# Patient Record
Sex: Female | Born: 1980 | Race: White | Hispanic: No | Marital: Married | Smoking: Never smoker
Health system: Southern US, Community
[De-identification: ages and names within clinical notes are randomized; demographics above are authoritative.]

## PROBLEM LIST (undated history)

## (undated) DIAGNOSIS — E042 Nontoxic multinodular goiter: Secondary | ICD-10-CM

## (undated) HISTORY — DX: Nontoxic multinodular goiter: E04.2

---

## 2018-11-01 ENCOUNTER — Encounter: Payer: Self-pay | Admitting: Obstetrics and Gynecology

## 2018-11-11 ENCOUNTER — Other Ambulatory Visit: Payer: Self-pay

## 2018-11-15 ENCOUNTER — Other Ambulatory Visit: Payer: Self-pay

## 2018-11-15 ENCOUNTER — Encounter: Payer: Self-pay | Admitting: Obstetrics and Gynecology

## 2018-11-15 ENCOUNTER — Ambulatory Visit (INDEPENDENT_AMBULATORY_CARE_PROVIDER_SITE_OTHER): Payer: Self-pay | Admitting: Obstetrics and Gynecology

## 2018-11-15 VITALS — BP 128/74 | HR 88 | Temp 97.5°F | Resp 12 | Ht 68.5 in | Wt 156.5 lb

## 2018-11-15 DIAGNOSIS — Z01419 Encounter for gynecological examination (general) (routine) without abnormal findings: Secondary | ICD-10-CM

## 2018-11-15 DIAGNOSIS — N6320 Unspecified lump in the left breast, unspecified quadrant: Secondary | ICD-10-CM | POA: Insufficient documentation

## 2018-11-15 NOTE — Progress Notes (Signed)
Spoke with patient while in office, interpretor present. Order placed for bilateral Dx MMG and left breast US at [facility]. Hx of left breast mass from previous imaging in [place], 6 mo f/u recommended. Spoke with Benjamine Mola at [facility], self pay quote provided to patient. [facility] contact information provided, [phone]. Patient will need to contact [facility] directly to provide additional information, they will assist with scheduling. Patient advised to bring copy of breast US report to appt once scheduled. Contact information provided to patient, aware to call if any assistance needed.

## 2018-11-15 NOTE — Patient Instructions (Signed)

## 2018-11-15 NOTE — Progress Notes (Signed)
38 y.o. [id number] Married [place] female here as a new patient for an annual exam. Patient has a family hx of Breast Cancer in Mother and Paternal grandmother  6 months ago she had an US of the left breast which was routine and a mass was noted.  BI-RADS 3.  She was told to do follow up in 6 months.  Her mother was diagnosed with with breast cancer recently in her 34s. Her paternal GM also have CA of breast.   She has left breast discomfort.   Husband has infertility due to motility.   Did routine labs 6 months ago, and were normal in [place].  Moved from [place] to work for 5 years.   PCP: No PCP     Patient's last menstrual period was 10/25/2018 (approximate).     Period Cycle (Days): 26 Period Duration (Days): 4 Period Pattern: Regular Menstrual Flow: Moderate Menstrual Control: Tampon, Maxi pad Menstrual Control Change Freq (Hours): 4 Dysmenorrhea: (!) Mild Dysmenorrhea Symptoms: Cramping, Headache     Sexually active: Yes.    The current method of family planning is none.    Exercising: Yes.    training and walking Smoker:  no  Health Maintenance: Pap:  About 6 months ago in [place] -- normal per patient History of abnormal Pap:  no MMG:  Patient has had left breast US -- BIRADS 3 per patient -- family hx of Breast Cancer Colonoscopy:  n/a BMD:   n/a  Result  n/a TDaP:  Per patient, has been a long time  Gardasil:   no HIV: negative in the past Hep C: negative in the past Screening Labs:  Discuss today   reports that she has never smoked. She has never used smokeless tobacco. She reports that she does not drink alcohol or use drugs.  History reviewed. No pertinent past medical history.  History reviewed. No pertinent surgical history.  No current outpatient medications on file.   No current facility-administered medications for this visit.     Family History  Problem Relation Age of Onset  . Breast cancer Mother   . Throat cancer Paternal Aunt   . Breast  cancer Paternal Grandmother     Review of Systems  Constitutional: Negative.   HENT: Negative.   Eyes: Negative.   Respiratory: Negative.   Cardiovascular: Negative.   Gastrointestinal: Negative.   Endocrine: Negative.   Genitourinary: Negative.   Musculoskeletal: Negative.   Skin: Negative.   Allergic/Immunologic: Negative.   Neurological: Negative.   Hematological: Negative.   Psychiatric/Behavioral: Negative.     Exam:   BP 128/74   Pulse 88   Temp (!) 97.5 F (36.4 C) (Temporal)   Resp 12   Ht 5' 8.5" (1.74 m)   Wt 156 lb 8 oz (71 kg)   LMP 10/25/2018 (Approximate)   BMI 23.45 kg/m     General appearance: alert, cooperative and appears stated age Head: normocephalic, without obvious abnormality, atraumatic Neck: no adenopathy, supple, symmetrical, trachea midline and thyroid normal to inspection and palpation Lungs: clear to auscultation bilaterally Breasts: normal appearance, no masses or tenderness, No nipple retraction or dimpling, No nipple discharge or bleeding, No axillary adenopathy Heart: regular rate and rhythm Abdomen: soft, non-tender; no masses, no organomegaly Extremities: extremities normal, atraumatic, no cyanosis or edema Skin: skin color, texture, turgor normal. No rashes or lesions Lymph nodes: cervical, supraclavicular, and axillary nodes normal. Neurologic: grossly normal  Pelvic: External genitalia:  no lesions  No abnormal inguinal nodes palpated.              Urethra:  normal appearing urethra with no masses, tenderness or lesions              Bartholins and Skenes: normal                 Vagina: normal appearing vagina with normal color and discharge, no lesions              Cervix: no lesions              Pap taken: No. Bimanual Exam:  Uterus:  normal size, contour, position, consistency, mobility, non-tender              Adnexa: no mass, fullness, tenderness                Chaperone was present for exam.  Assessment:    Well woman visit with normal exam. Hx left breast mass on [place] in [place]. Fh breast cancer.  Thyroid nodules.  Hx infertility.   Plan:   Dx bilateral mammogram and left breast US at [facility].  Self breast awareness reviewed. Guidelines for Calcium, Vitamin D, regular exercise program including cardiovascular and weight bearing exercise. She will contact her provider in [place] to understand recommendations for follow up of the thyroid nodules. PNV.  Follow up annually and prn.   After visit summary provided.

## 2018-11-16 ENCOUNTER — Telehealth (HOSPITAL_COMMUNITY): Payer: Self-pay

## 2018-11-16 NOTE — Telephone Encounter (Signed)
Telephoned patient at home number. Left patient message to call [facility] and schedule appt.

## 2018-11-21 ENCOUNTER — Other Ambulatory Visit (HOSPITAL_COMMUNITY): Payer: Self-pay | Admitting: *Deleted

## 2018-11-21 DIAGNOSIS — N6320 Unspecified lump in the left breast, unspecified quadrant: Secondary | ICD-10-CM

## 2018-12-06 ENCOUNTER — Other Ambulatory Visit: Payer: Self-pay

## 2018-12-06 ENCOUNTER — Ambulatory Visit (HOSPITAL_COMMUNITY): Payer: Self-pay

## 2018-12-22 ENCOUNTER — Encounter (HOSPITAL_COMMUNITY): Payer: Self-pay

## 2018-12-22 ENCOUNTER — Other Ambulatory Visit: Payer: Self-pay

## 2018-12-22 ENCOUNTER — Ambulatory Visit (HOSPITAL_COMMUNITY)
Admission: RE | Admit: 2018-12-22 | Discharge: 2018-12-22 | Disposition: A | Discharge status: Home / Self Care | Payer: Self-pay | Source: Ambulatory Visit | Attending: Obstetrics and Gynecology | Admitting: Obstetrics and Gynecology

## 2018-12-22 VITALS — BP 116/60 | Temp 97.5°F | Wt 153.8 lb

## 2018-12-22 DIAGNOSIS — N6320 Unspecified lump in the left breast, unspecified quadrant: Secondary | ICD-10-CM

## 2018-12-22 DIAGNOSIS — Z1239 Encounter for other screening for malignant neoplasm of breast: Secondary | ICD-10-CM | POA: Insufficient documentation

## 2018-12-22 NOTE — Patient Instructions (Signed)
Explained breast self awareness with Maceo Pro. Patient did not need a Pap smear today due to last Pap smear was in March 2020 per patient. Let her know [facility] will cover Pap smears every 3 years unless has a history of abnormal Pap smears. Referred patient to the [facility] for a diagnostic mammogram and left breast ultrasound. Appointment scheduled for Friday, December 23, 2018 at 1120.     Patient aware of appointment and will be there. Akeyla Molden verbalized understanding.  Nichalas Coin, Arvil Chaco, RN 3:38 PM

## 2018-12-22 NOTE — Progress Notes (Signed)
Complaints of left breast lump x 7 months that is painful. Patient states the pain comes and goes. Patient rates the pain at a 4 out of 10.  Pap Smear: Pap smear not completed today. Last Pap smear was in March 2020 in [place] and normal per patient. Per patient has no history of an abnormal Pap smear. No Pap smear results are in Epic.  Physical exam: Breasts Breasts symmetrical. No skin abnormalities bilateral breasts. No nipple retraction bilateral breasts. No nipple discharge bilateral breasts. No lymphadenopathy. No lumps palpated right breast. Palpated a mobile lump within the left breast at 3 o'clock 7 cm from the nipple. Complaints of tenderness when palpated lump. Referred patient to the [facility] for a diagnostic mammogram and left breast ultrasound. Appointment scheduled for Friday, December 23, 2018 at 1120.        Pelvic/Bimanual No Pap smear completed today since last Pap smear was in March 2020 per patient. Pap smear not indicated per [facility] guidelines.   Smoking History: Patient has never smoked.  Patient Navigation: Patient education provided. Access to services provided for patient through [facility] program. [place] interpreter provided.   Breast and Cervical Cancer Risk Assessment: Patient has a family history of her mother, paternal grandmother, and a paternal aunt having breast cancer. Patient has no known genetic mutations or history of radiation treatment to the chest before age 77. Patient has no history of cervical dysplasia, immunocompromised, or DES exposure in-utero.  Risk Assessment    Risk Scores      12/22/2018   Last edited by: Loletta Parish, RN   5-year risk: 0.9 %   Lifetime risk: 19 %         Used [place] interpreter [employer] from [employer].

## 2018-12-23 ENCOUNTER — Ambulatory Visit
Admission: RE | Admit: 2018-12-23 | Discharge: 2018-12-23 | Disposition: A | Discharge status: Home / Self Care | Payer: Self-pay | Source: Ambulatory Visit | Attending: Obstetrics and Gynecology | Admitting: Obstetrics and Gynecology

## 2018-12-23 ENCOUNTER — Ambulatory Visit
Admission: RE | Admit: 2018-12-23 | Discharge: 2018-12-23 | Disposition: A | Discharge status: Home / Self Care | Payer: No Typology Code available for payment source | Source: Ambulatory Visit | Attending: Obstetrics and Gynecology | Admitting: Obstetrics and Gynecology

## 2018-12-23 ENCOUNTER — Other Ambulatory Visit (HOSPITAL_COMMUNITY): Payer: Self-pay | Admitting: Obstetrics and Gynecology

## 2018-12-23 DIAGNOSIS — N6320 Unspecified lump in the left breast, unspecified quadrant: Secondary | ICD-10-CM

## 2019-01-02 ENCOUNTER — Telehealth: Payer: Self-pay | Admitting: *Deleted

## 2019-01-02 NOTE — Telephone Encounter (Signed)
AEX 11/15/18  Patient needed 6 mo f/u for left breast mass, previously followed in [place].   Left breast Dx IMG and left breast US completed oon 12/23/18 at [facility] through [facility].   Birads 2: negative. Screening MMG at age 38.    Dr. Quincy Simmonds -ok to remove from [facility] hold?

## 2019-01-03 NOTE — Telephone Encounter (Signed)
Please remove from mammogram hold and return to routine screening.  

## 2019-01-03 NOTE — Telephone Encounter (Signed)
Patient removed from MMG hold.   Encounter closed.  

## 2020-09-19 IMAGING — [facility] DIGITAL DIAGNOSTIC BILAT W/ TOMO
8 series · 8 of 24 positions shown · non-contrast
Comparison: Prior ultrasound report from [facility].
COMPARISON: Prior breast ultrasound report from [facility]. No prior
images are available.

*** End of Addendum ***
COMPARISON: Prior ultrasound report from [facility].

Addendum:
CLINICAL DATA: 38-year-old patient presents for evaluation of the
left breast. A [place] translator was present throughout the
patient's visit today and has access to the patient's prior
radiology report from [facility], which we reviewed. It described benign
cysts in the left breast. After mammogram images were performed
today, the patient reports a palpable lump in the left breast, which
was evaluated with ultrasound. She has a family history of breast
cancer in her mother.

EXAM:
DIGITAL DIAGNOSTIC BILATERAL MAMMOGRAM WITH CAD AND TOMO
ULTRASOUND LEFT BREAST

[L MLO synth-2D]
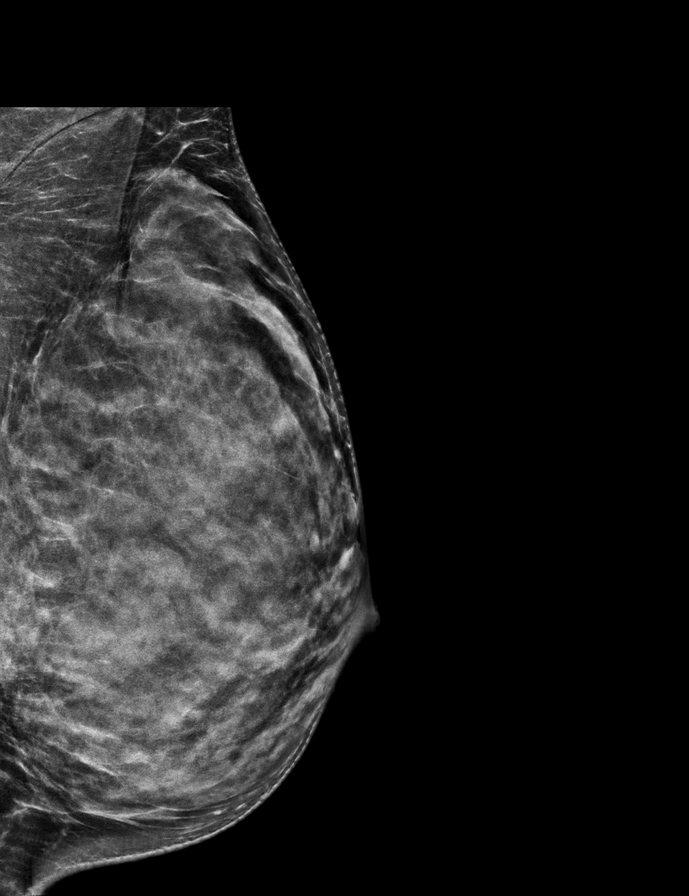

[R CC synth-2D]
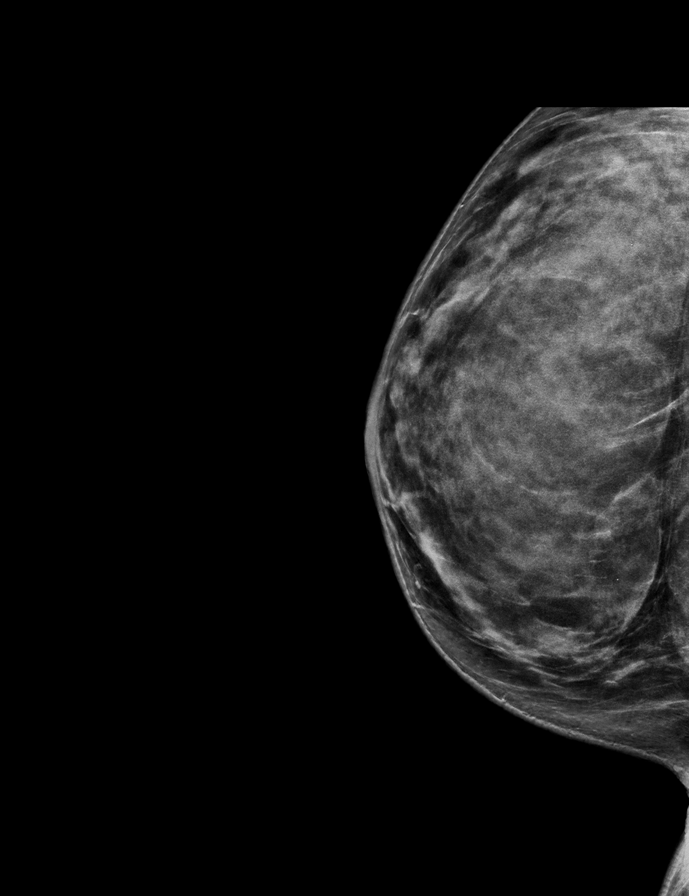

[R MLO synth-2D]
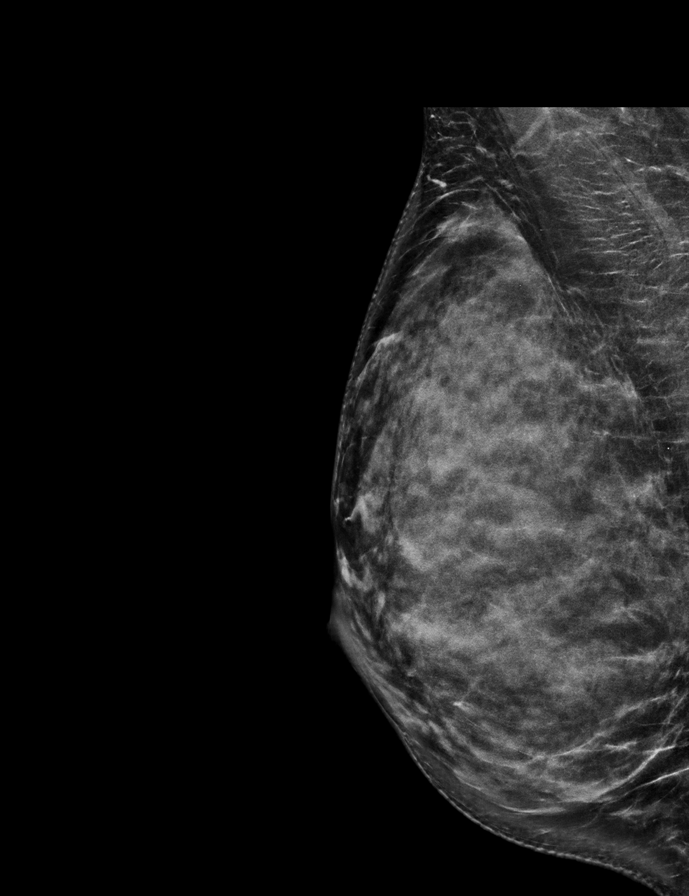

[L CC synth-2D]
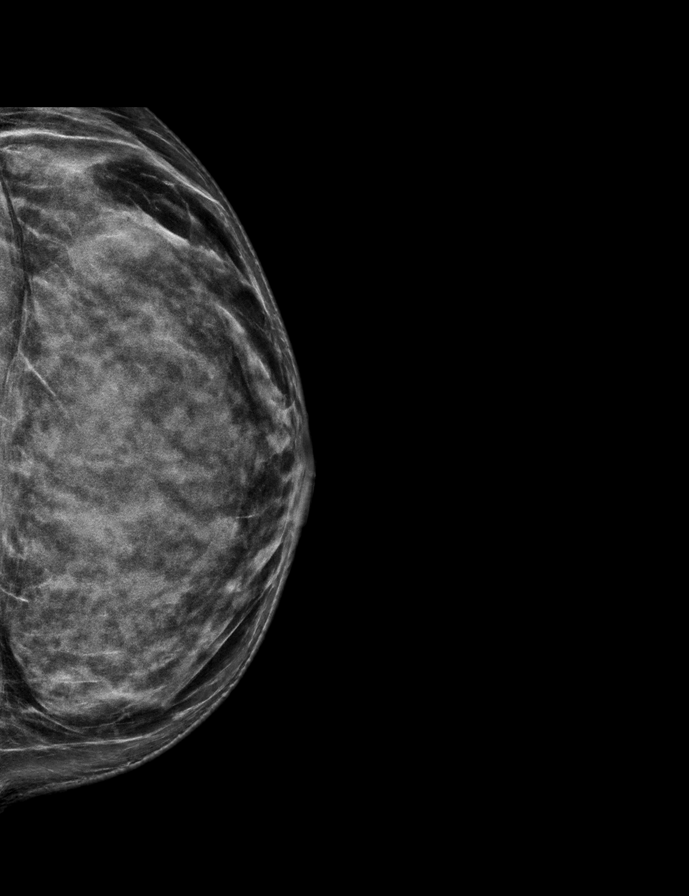

[R CC tomo · tomo slice 35/69.0]
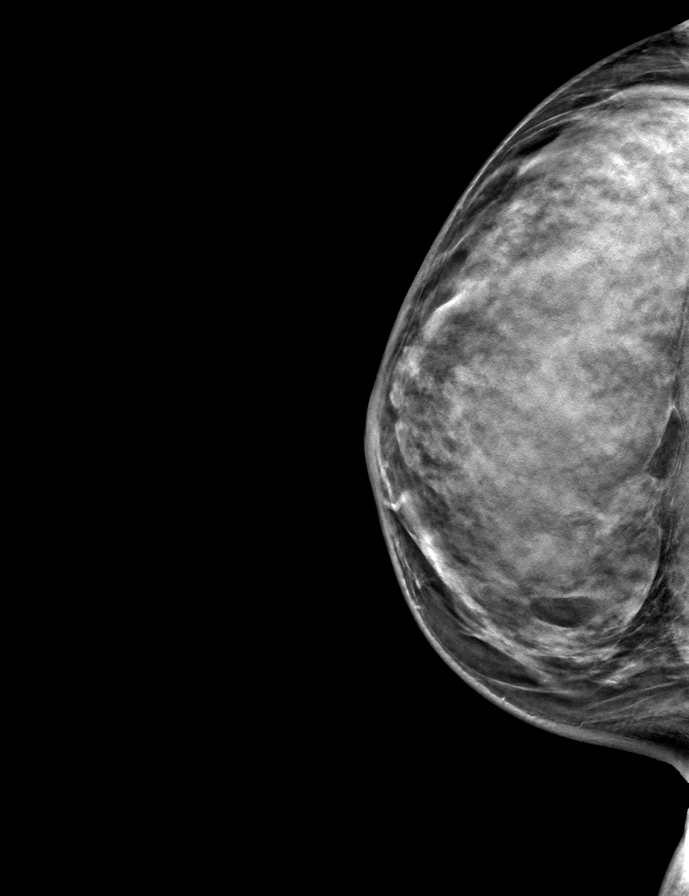

[L MLO tomo · tomo slice 33/65.0]
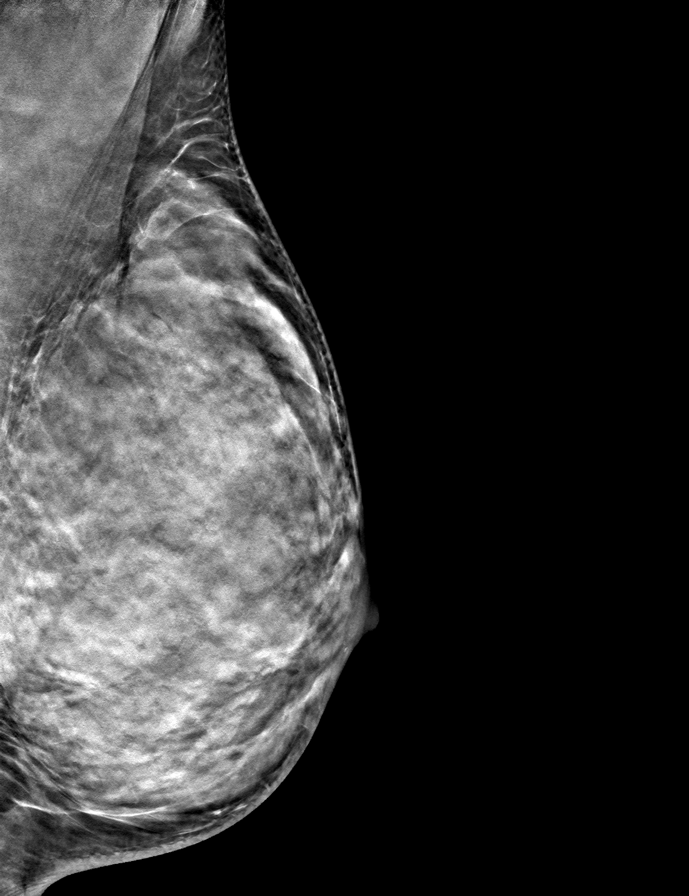

[L CC tomo · tomo slice 35/70.0]
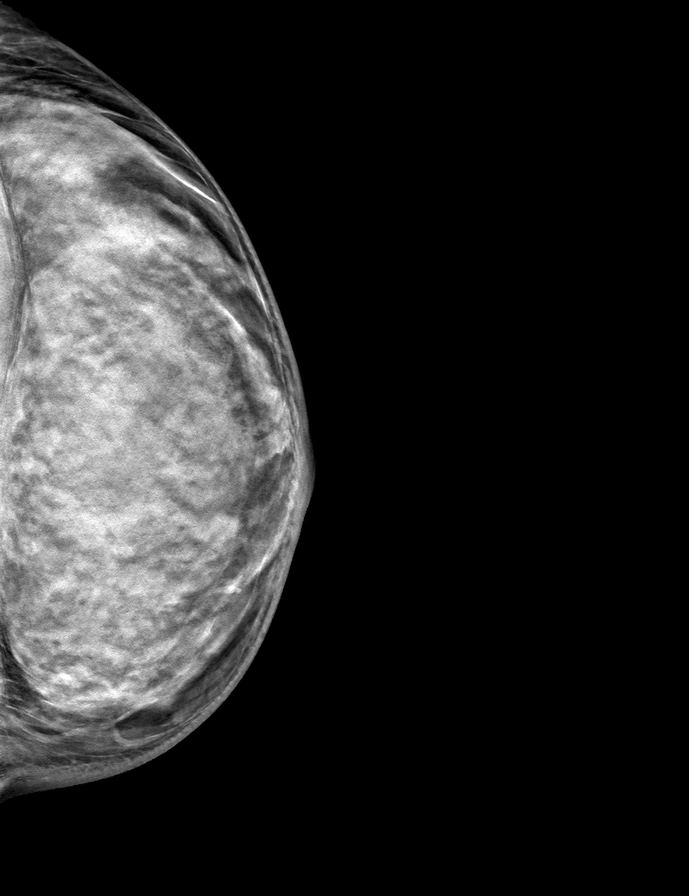

[R MLO tomo · tomo slice 36/71.0]
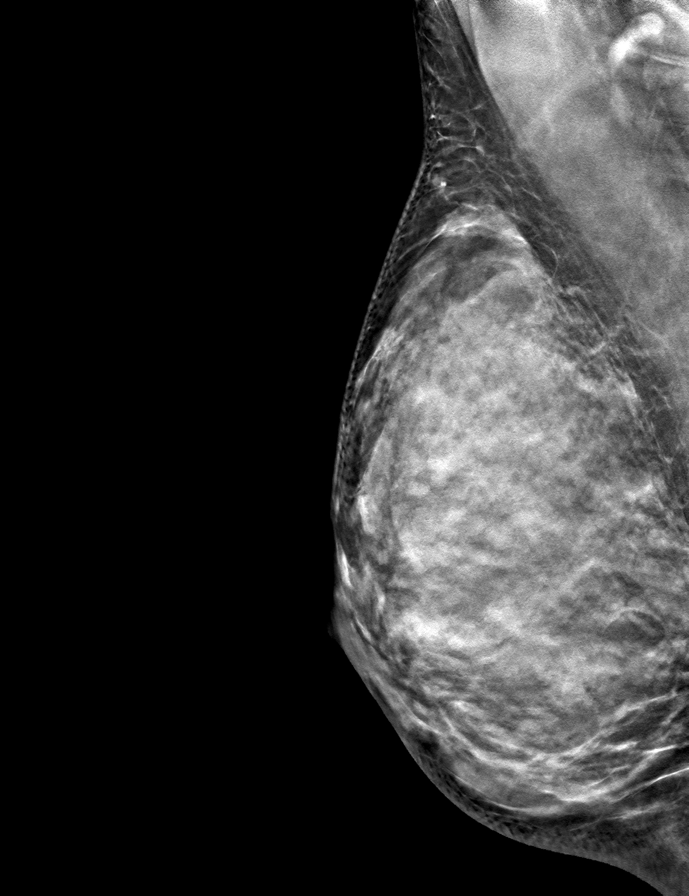

[8 of 24 positions shown; findings below may reference images not displayed]

No prior
images available.

ACR Breast Density Category d: The breast tissue is extremely dense,
which lowers the sensitivity of mammography.
FINDINGS: No mass, architectural distortion, or suspicious microcalcification
is identified to suggest malignancy in either breast.

Mammographic images were processed with CAD.

Targeted ultrasound is performed, showing dense breast parenchyma in
the region of palpable concern centered at 2 o'clock position 9 cm
from the nipple. Within the dense parenchyma is a cluster of cysts
measuring in total 0.9 x 0.6 x 0.9 cm. No vascular flow is seen
within the cyst cluster. No suspicious mass identified.
IMPRESSION: Benign cluster of cysts in the 2 o'clock position left breast. No
evidence of malignancy in either breast. Extremely dense breast
parenchyma.

RECOMMENDATION:
Screening mammogram at age 40 unless there are persistent or
intervening clinical concerns. (Code:JU-T-CEO)

I have discussed the findings and recommendations with the patient.
If applicable, a reminder letter will be sent to the patient
regarding the next appointment.

BI-RADS CATEGORY  2: Benign.

ADDENDUM:
This addendum is to correct a typographical error in the comparison
section. This section should read as follows:
No prior
images available.

ACR Breast Density Category d: The breast tissue is extremely dense,
which lowers the sensitivity of mammography.
FINDINGS: No mass, architectural distortion, or suspicious microcalcification
is identified to suggest malignancy in either breast.

Mammographic images were processed with CAD.

Targeted ultrasound is performed, showing dense breast parenchyma in
the region of palpable concern centered at 2 o'clock position 9 cm
from the nipple. Within the dense parenchyma is a cluster of cysts
measuring in total 0.9 x 0.6 x 0.9 cm. No vascular flow is seen
within the cyst cluster. No suspicious mass identified.
IMPRESSION: Benign cluster of cysts in the 2 o'clock position left breast. No
evidence of malignancy in either breast. Extremely dense breast
parenchyma.

RECOMMENDATION:
Screening mammogram at age 40 unless there are persistent or
intervening clinical concerns. (Code:JU-T-CEO)

I have discussed the findings and recommendations with the patient.
If applicable, a reminder letter will be sent to the patient
regarding the next appointment.

BI-RADS CATEGORY  2: Benign.
